# Patient Record
Sex: Male | Born: 2000 | Race: White | Hispanic: No | Marital: Single
Health system: Southern US, Community
[De-identification: ages and names within clinical notes are randomized; demographics above are authoritative.]

---

## 2019-11-20 ENCOUNTER — Emergency Department (HOSPITAL_COMMUNITY): Payer: BC Managed Care – PPO

## 2019-11-20 ENCOUNTER — Emergency Department (HOSPITAL_COMMUNITY)
Admission: EM | Admit: 2019-11-20 | Discharge: 2019-11-20 | Disposition: A | Discharge status: Home / Self Care | Payer: BC Managed Care – PPO | Attending: Emergency Medicine | Admitting: Emergency Medicine

## 2019-11-20 ENCOUNTER — Encounter (HOSPITAL_COMMUNITY): Payer: Self-pay | Admitting: Emergency Medicine

## 2019-11-20 DIAGNOSIS — R58 Hemorrhage, not elsewhere classified: Secondary | ICD-10-CM

## 2019-11-20 DIAGNOSIS — F17210 Nicotine dependence, cigarettes, uncomplicated: Secondary | ICD-10-CM | POA: Insufficient documentation

## 2019-11-20 LAB — CBC WITH DIFFERENTIAL/PLATELET
Abs Immature Granulocytes: 0.03 10*3/uL (ref 0.00–0.07)
Basophils Absolute: 0.1 10*3/uL (ref 0.0–0.1)
Basophils Relative: 1 %
Eosinophils Absolute: 0.1 10*3/uL (ref 0.0–0.5)
Eosinophils Relative: 1 %
HCT: 44.5 % (ref 39.0–52.0)
Hemoglobin: 14.5 g/dL (ref 13.0–17.0)
Immature Granulocytes: 0 %
Lymphocytes Relative: 21 %
Lymphs Abs: 1.4 10*3/uL (ref 0.7–4.0)
MCH: 31.2 pg (ref 26.0–34.0)
MCHC: 32.6 g/dL (ref 30.0–36.0)
MCV: 95.7 fL (ref 80.0–100.0)
Monocytes Absolute: 0.4 10*3/uL (ref 0.1–1.0)
Monocytes Relative: 7 %
Neutro Abs: 4.8 10*3/uL (ref 1.7–7.7)
Neutrophils Relative %: 70 %
Platelets: 188 10*3/uL (ref 150–400)
RBC: 4.65 MIL/uL (ref 4.22–5.81)
RDW: 13.2 % (ref 11.5–15.5)
WBC: 6.8 10*3/uL (ref 4.0–10.5)
nRBC: 0 % (ref 0.0–0.2)

## 2019-11-20 LAB — COMPREHENSIVE METABOLIC PANEL
ALT: 22 U/L (ref 0–44)
AST: 33 U/L (ref 15–41)
Albumin: 4.3 g/dL (ref 3.5–5.0)
Alkaline Phosphatase: 52 U/L (ref 38–126)
Anion gap: 8 (ref 5–15)
BUN: 16 mg/dL (ref 6–20)
CO2: 28 mmol/L (ref 22–32)
Calcium: 9.2 mg/dL (ref 8.9–10.3)
Chloride: 105 mmol/L (ref 98–111)
Creatinine, Ser: 1.03 mg/dL (ref 0.61–1.24)
GFR calc Af Amer: >60 mL/min (ref >60)
GFR calc non Af Amer: >60 mL/min (ref >60)
Glucose, Bld: 96 mg/dL (ref 70–99)
Potassium: 4.3 mmol/L (ref 3.5–5.1)
Sodium: 141 mmol/L (ref 135–145)
Total Bilirubin: 0.4 mg/dL (ref 0.3–1.2)
Total Protein: 7.2 g/dL (ref 6.5–8.1)

## 2019-11-20 LAB — PROTIME-INR
INR: 1 (ref 0.8–1.2)
Prothrombin Time: 13 seconds (ref 11.4–15.2)

## 2019-11-20 NOTE — Discharge Instructions (Addendum)
Please return to the emergency department with new or worsening symptoms.  It was a pleasure to meet you.  Please follow-up with your regular doctor as soon as possible for reevaluation.

## 2019-11-20 NOTE — ED Triage Notes (Signed)
Per EMS-states patient woke up this am with blood around mouth and on pillow-patient does know where he might have been bleeding from-no complaints

## 2019-11-20 NOTE — ED Provider Notes (Signed)
[facility]-EMERGENCY DEPT Provider Note   CSN: [id number] Arrival date & time: 11/20/19  4098     History Chief Complaint  Patient presents with  . bleeding from mouth    Alan Middleton is a 19 y.o. male.  HPI   Patient is a 19 year old male with no known medical history.  He presents today due to an episode of blood loss that occurred last night.  Patient states yesterday he smoked a few cigarettes and had a couple beers with friends.  He states he was feeling normal and had a normal night.  He went to bed and woke this morning covered in blood all along his head and across his bed.  He states there was "a pool of jelly like coagulated blood" next to his head.  He notes a small amount of dried blood in his mouth.  He denies any complaints last night, this morning, or at this moment.  He has had no abdominal pain, nausea, no vomiting, chest pain, shortness of breath, weakness, fatigue, dizziness, lightheadedness, syncope.     History reviewed. No pertinent past medical history.  There are no problems to display for this patient.   History reviewed. No pertinent surgical history.     No family history on file.  Social History   Tobacco Use  . Smoking status: Not on file  Substance Use Topics  . Alcohol use: Not Currently  . Drug use: Not Currently    Home Medications Prior to Admission medications   Not on File    Allergies    Patient has no allergy information on record.  Review of Systems   Review of Systems  Constitutional: Negative for chills and fever.  HENT: Negative for congestion, rhinorrhea and sore throat.   Respiratory: Negative for shortness of breath.   Cardiovascular: Negative for chest pain.  Gastrointestinal: Negative for abdominal pain, diarrhea, nausea and vomiting.  Genitourinary: Negative for dysuria and hematuria.  Neurological: Negative for dizziness and syncope.   Physical Exam Updated Vital Signs BP 130/72 (BP  Location: Left Arm)   Pulse 73   Temp 98.2 F (36.8 C) (Oral)   Resp 16   SpO2 95%   Physical Exam Vitals and nursing note reviewed.  Constitutional:      General: He is not in acute distress.    Appearance: Normal appearance. He is normal weight. He is not ill-appearing, toxic-appearing or diaphoretic.  HENT:     Head: Normocephalic and atraumatic.     Comments: Dried blood noted along the left face and left ear.    Right Ear: Tympanic membrane, ear canal and external ear normal. There is no impacted cerumen.     Left Ear: Tympanic membrane, ear canal and external ear normal. There is no impacted cerumen.     Ears:     Comments: Bilateral EACs are clear and TMs appear normal.  No blood noted in the EACs bilaterally.    Nose: Nose normal.     Comments: No sign of blood or trauma noted within the bilateral nares.    Mouth/Throat:     Mouth: Mucous membranes are moist.     Pharynx: Oropharynx is clear. No oropharyngeal exudate or posterior oropharyngeal erythema.     Comments: No signs of blood or trauma within the mouth or posterior oropharynx. Eyes:     General: No scleral icterus.       Right eye: No discharge.        Left eye: No  discharge.     Extraocular Movements: Extraocular movements intact.     Conjunctiva/sclera: Conjunctivae normal.     Comments: No pallor appreciated along the inferior palpebral conjunctiva.  Cardiovascular:     Rate and Rhythm: Normal rate and regular rhythm.     Pulses: Normal pulses.     Heart sounds: Normal heart sounds. No murmur heard.  No friction rub. No gallop.      Comments: Heart is regular rate and rhythm.  No murmurs, rubs, or gallops. Pulmonary:     Effort: Pulmonary effort is normal. No respiratory distress.     Breath sounds: Normal breath sounds. No stridor. No wheezing, rhonchi or rales.  Abdominal:     General: Abdomen is flat.     Palpations: Abdomen is soft.     Tenderness: There is no abdominal tenderness.   Musculoskeletal:        General: Normal range of motion.     Cervical back: Normal range of motion and neck supple. No tenderness.  Skin:    General: Skin is warm and dry.     Coloration: Skin is not pale.     Findings: No bruising.  Neurological:     General: No focal deficit present.     Mental Status: He is alert and oriented to person, place, and time.  Psychiatric:        Mood and Affect: Mood normal.        Behavior: Behavior normal.    ED Results / Procedures / Treatments   Labs (all labs ordered are listed, but only abnormal results are displayed) Labs Reviewed  CBC WITH DIFFERENTIAL/PLATELET  COMPREHENSIVE METABOLIC PANEL  PROTIME-INR   EKG None  Radiology DG Chest Portable 1 View  Result Date: 11/20/2019 CLINICAL DATA:  Hemoptysis EXAM: PORTABLE CHEST 1 VIEW COMPARISON:  None. FINDINGS: The heart size and mediastinal contours are within normal limits. Both lungs are clear. No pleural effusion. The visualized skeletal structures are unremarkable. IMPRESSION: No acute process in the chest. Electronically Signed   By: Guadlupe Spanish M.D.   On: 11/20/2019 14:09   Procedures Procedures (including critical care time)  Medications Ordered in ED Medications - No data to display  ED Course  I have reviewed the triage vital signs and the nursing notes.  Pertinent labs & imaging results that were available during my care of the patient were reviewed by me and considered in my medical decision making (see chart for details).    MDM Rules/Calculators/A&P                          Pt is a 19 y.o. male that presents with a history, physical exam, and ED Clinical Course as noted above.   Patient presents today due to what appears to be an episode of blood loss that occurred last night while asleep.  He has no complaints at this time and his physical exam is benign.  Due to the nature of his symptoms I obtained basic labs, PT/INR, chest x-ray.  All of his lab work is  benign.  Patient has a hemoglobin of 14.5 and platelets 188.  Patient has had no complaints since arriving.  He is eager to be discharged and is planning on going home to [place] at this time and following up with his primary care provider.  He understands he needs to return to the emergency department if he develops new or worsening symptoms.  Patient is hemodynamically stable  and in NAD at the time of d/c. Evaluation does not show pathology that would require ongoing emergent intervention or inpatient treatment. I explained the diagnosis to the patient. Patient is comfortable with above plan and is stable for discharge at this time. All questions were answered prior to disposition. Strict return precautions for returning to the ED were discussed. Encouraged follow up with PCP.    An After Visit Summary was printed and given to the patient.  Patient discharged to home/self care.  Condition at discharge: Stable  Note: Portions of this report may have been transcribed using voice recognition software. Every effort was made to ensure accuracy; however, inadvertent computerized transcription errors may be present.   Final Clinical Impression(s) / ED Diagnoses Final diagnoses:  Bleeding   Rx / DC Orders ED Discharge Orders    None       Placido Sou, PA-C 11/20/19 1425    Tegeler, Canary Brim, MD 11/20/19 1430

## 2022-03-04 IMAGING — DX DG CHEST 1V PORT
1 series · 1 of 1 positions shown · non-contrast
Comparison: None.

CLINICAL DATA: Hemoptysis

EXAM:
PORTABLE CHEST 1 VIEW

[chest ap]
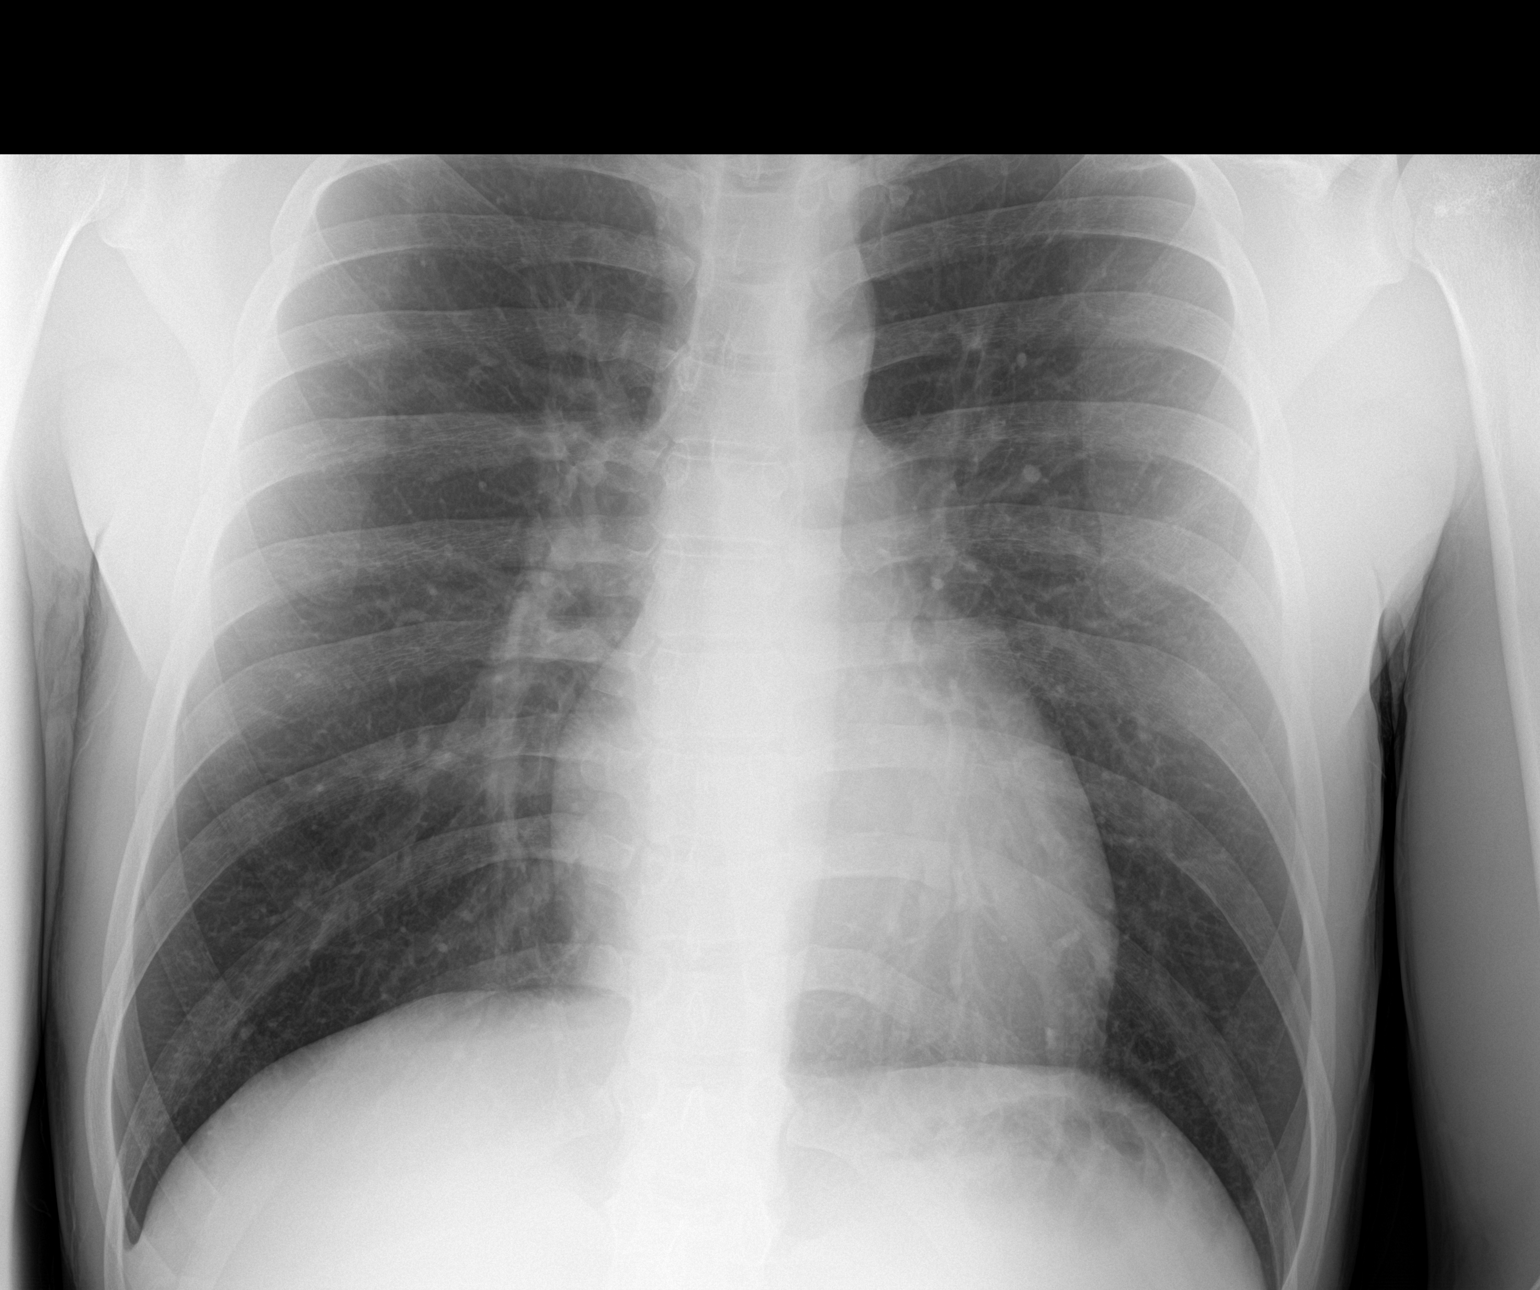

[1 of 1 positions shown; findings below may reference images not displayed]

FINDINGS: The heart size and mediastinal contours are within normal limits.
Both lungs are clear. No pleural effusion. The visualized skeletal
structures are unremarkable.
IMPRESSION: No acute process in the chest.
